# Patient Record
Sex: Female | Born: 1985 | Race: White | Hispanic: No | Marital: Single
Health system: Southern US, Community
[De-identification: ages and names within clinical notes are randomized; demographics above are authoritative.]

---

## 2017-03-19 ENCOUNTER — Ambulatory Visit
Admission: RE | Admit: 2017-03-19 | Discharge: 2017-03-19 | Disposition: A | Discharge status: Home / Self Care | Payer: Worker's Compensation | Source: Ambulatory Visit | Attending: Physician Assistant | Admitting: Physician Assistant

## 2017-03-19 ENCOUNTER — Other Ambulatory Visit: Payer: Self-pay | Admitting: Physician Assistant

## 2017-03-19 DIAGNOSIS — M542 Cervicalgia: Secondary | ICD-10-CM

## 2019-05-05 IMAGING — CR DG CERVICAL SPINE COMPLETE 4+V
6 series · 6 of 6 positions shown · non-contrast
Comparison: None.

CLINICAL DATA: Posterior lower cervical pain.

EXAM:
CERVICAL SPINE - COMPLETE 4+ VIEW

[c-spine lat]
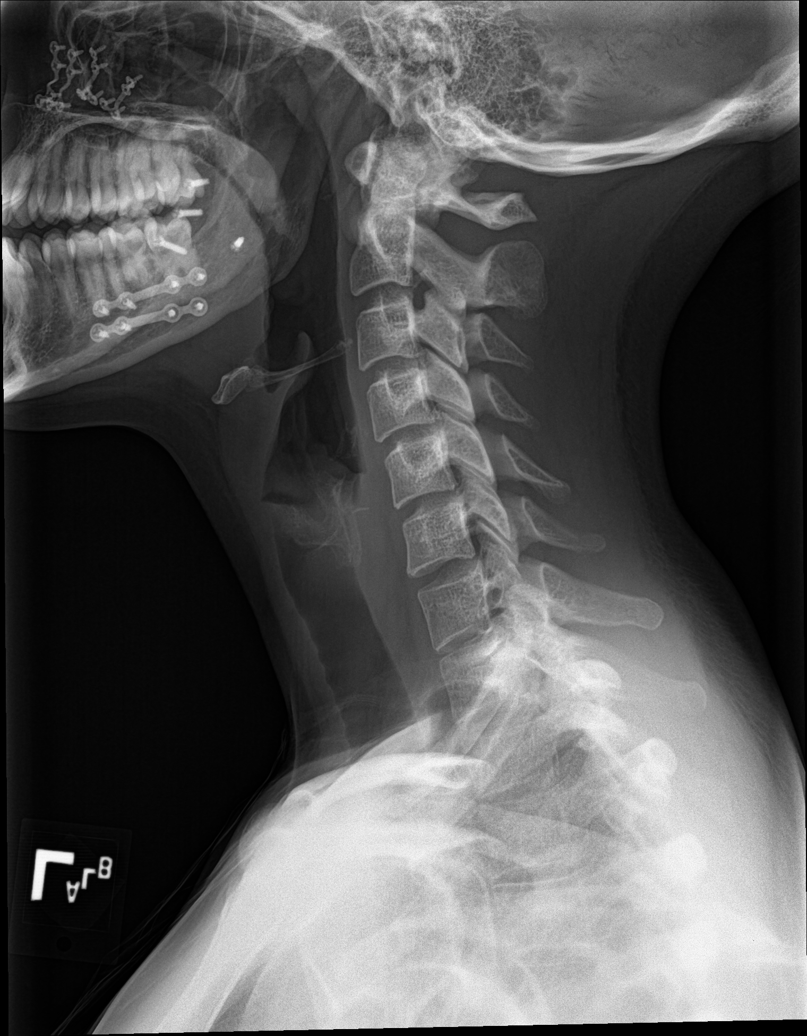

[c-spine obl (1 of 2)]
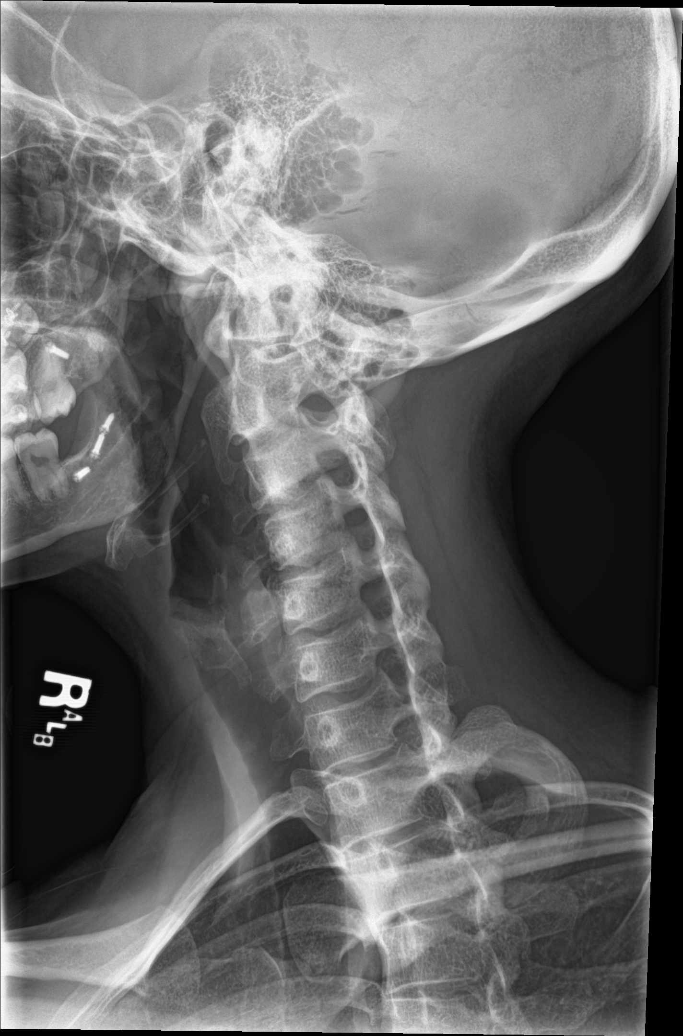

[c-spine obl (2 of 2)]
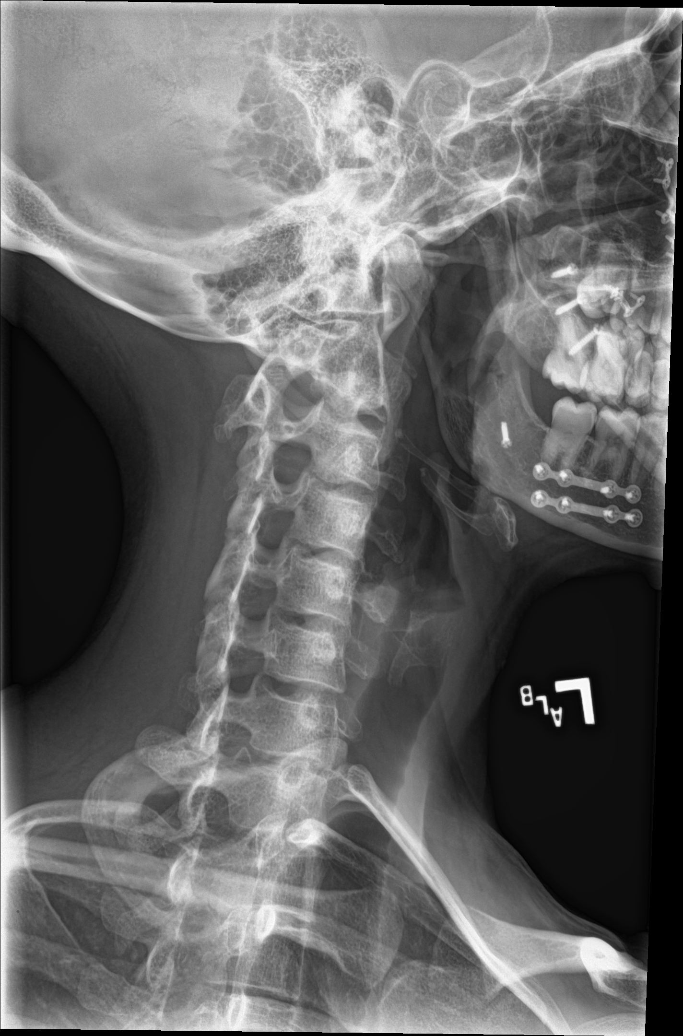

[c-spine ap]
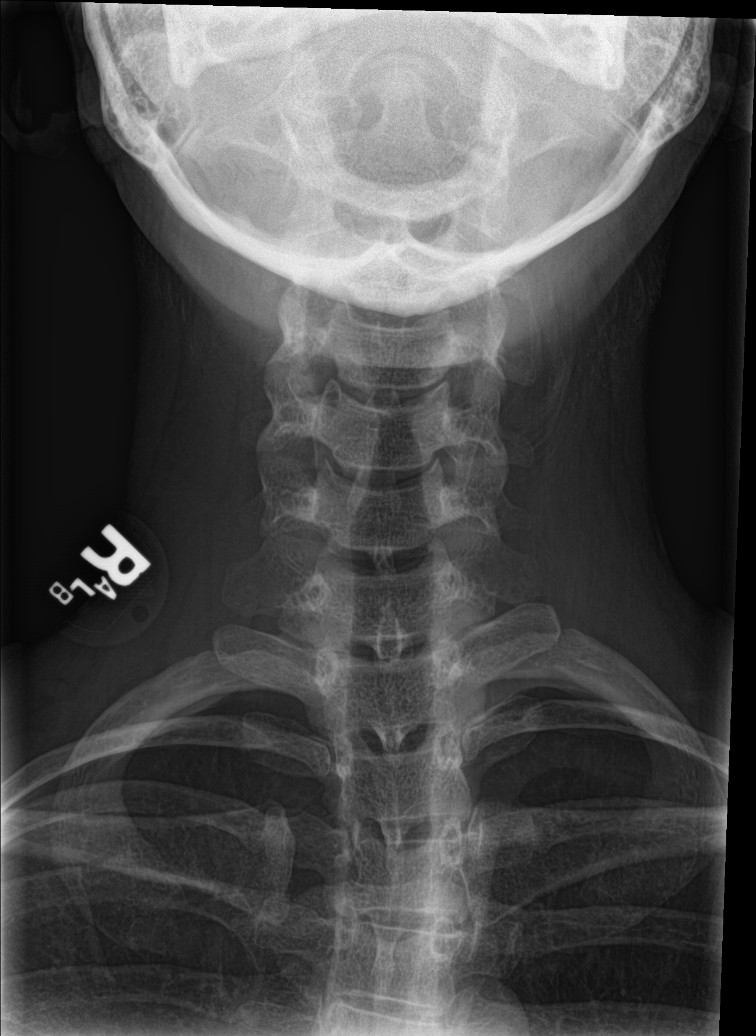

[c-spine open mouth]
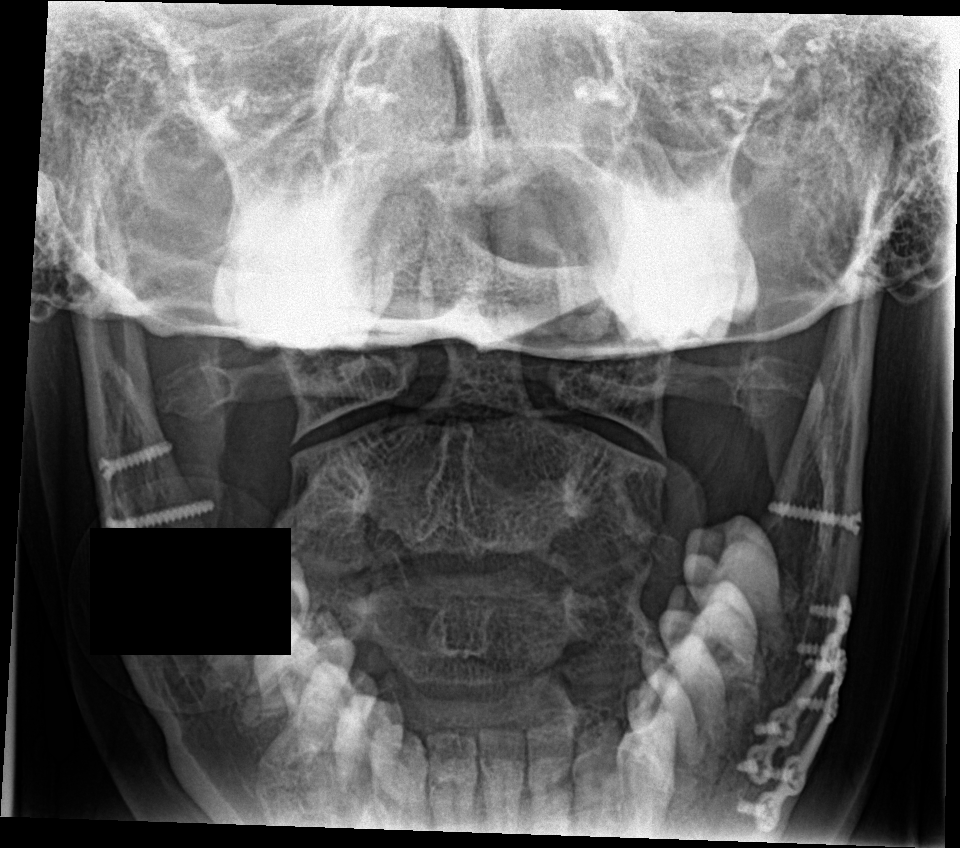

[[person_name]]
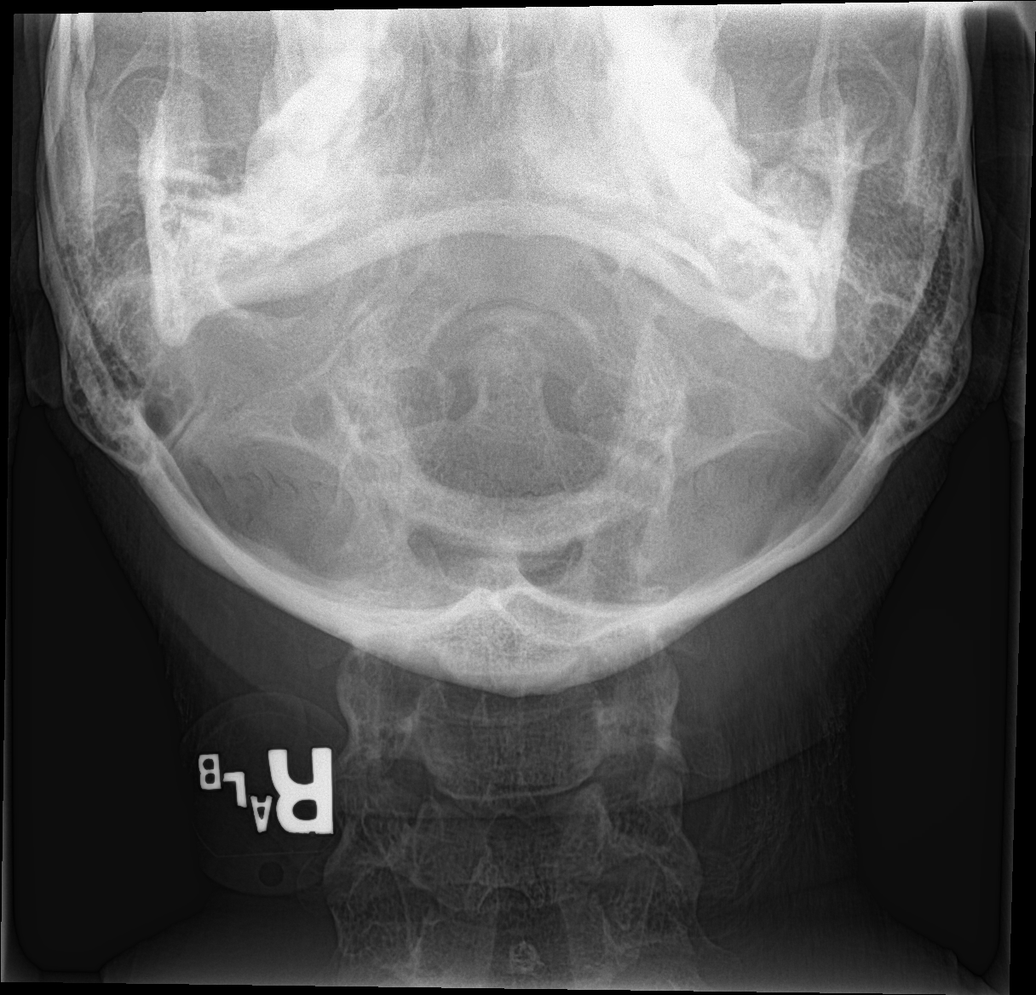

[6 of 6 positions shown; findings below may reference images not displayed]

FINDINGS: Normal alignment of the cervical spine and cervicothoracic junction.
Vertebral body heights and disc spaces are maintained. The
prevertebral soft tissues are normal. No bony encroachment of the
neural foramen on the oblique imaging. Postsurgical changes in the
mandible. Lung apices are clear. Negative for fracture or
dislocation.
IMPRESSION: Negative cervical spine radiographs.

## 2020-03-08 ENCOUNTER — Ambulatory Visit (INDEPENDENT_AMBULATORY_CARE_PROVIDER_SITE_OTHER): Payer: BC Managed Care – PPO | Admitting: Podiatry

## 2020-03-08 ENCOUNTER — Ambulatory Visit: Payer: BC Managed Care – PPO | Admitting: Podiatry

## 2020-03-08 ENCOUNTER — Ambulatory Visit: Payer: BC Managed Care – PPO

## 2020-03-08 ENCOUNTER — Other Ambulatory Visit: Payer: Self-pay

## 2020-03-08 DIAGNOSIS — L989 Disorder of the skin and subcutaneous tissue, unspecified: Secondary | ICD-10-CM | POA: Diagnosis not present

## 2020-03-08 DIAGNOSIS — M21622 Bunionette of left foot: Secondary | ICD-10-CM | POA: Diagnosis not present

## 2020-03-08 NOTE — Progress Notes (Signed)
° °  HPI: 34 y.o. female presenting today as a new patient for evaluation of multiple complaints to specifically the left foot.  Patient states that she has had some symptomatic calluses to the plantar aspect of the left foot.  Also she has noticed that the bone sticking out the lateral aspect of the left foot.  It is more prominent with weightbearing.  Finally the patient states that she has had some discoloration to the bilateral hallux nail plates and she is concerned for possible toenail fungus.  She has not done anything for treatment she is presents for further treatment evaluation  No past medical history on file.   Physical Exam: General: The patient is alert and oriented x3 in no acute distress.  Dermatology: Skin is warm, dry and supple bilateral lower extremities. Negative for open lesions or macerations.  There is some discoloration to the distal tips of the bilateral hallux nail plates.  No thickening noted.  No onychochauxis or dystrophy. Hyperkeratotic callus tissue noted with a central nucleated core consistent with findings of a porokeratosis x2 left forefoot  Vascular: Palpable pedal pulses bilaterally. No edema or erythema noted. Capillary refill within normal limits.  Neurological: Epicritic and protective threshold grossly intact bilaterally.   Musculoskeletal Exam: Range of motion within normal limits to all pedal and ankle joints bilateral. Muscle strength 5/5 in all groups bilateral.  Clinical evidence of a tailor's bunionette deformity noted to the left foot   Assessment: 1.  Discoloration of distal tips of the bilateral hallux nail plates 2.  Porokeratosis x2 left forefoot 3.  Tailor's bunionette left   Plan of Care:  1. Patient evaluated.  2.  Explained that the discoloration of the tips of the bilateral hallux nail plates do not appear to be fungal related.  That should grow out over the next 2-3 months 3.  Excisional debridement of the porokeratotic lesions was  performed with a chisel blade without incident or bleeding.  Salicylic acid applied. 4.  Recommend OTC corn callus remover x3 weeks 5.  Explained the etiology and structural deformity of tailor's bunionette deformities.  Explained that if the bunionette is not necessarily symptomatic I do not recommend surgical intervention and conservative treatment which would include wider fitting shoes or shoe gear that does not irritate the area 6.  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 Felecia Shelling, DPM    [address] [place], [place] [address]                Office [phone]  Fax [phone]
